# Patient Record
Sex: Female | Born: 1990 | Race: White | Hispanic: No | Marital: Married | Smoking: Never smoker
Health system: Southern US, Community
[De-identification: ages and names within clinical notes are randomized; demographics above are authoritative.]

## PROBLEM LIST (undated history)

## (undated) ENCOUNTER — Inpatient Hospital Stay (HOSPITAL_COMMUNITY): Payer: Self-pay

## (undated) DIAGNOSIS — Z789 Other specified health status: Secondary | ICD-10-CM

## (undated) DIAGNOSIS — O26873 Cervical shortening, third trimester: Secondary | ICD-10-CM

## (undated) DIAGNOSIS — O3433 Maternal care for cervical incompetence, third trimester: Secondary | ICD-10-CM

## (undated) DIAGNOSIS — O4700 False labor before 37 completed weeks of gestation, unspecified trimester: Secondary | ICD-10-CM

## (undated) HISTORY — PX: ANTERIOR CRUCIATE LIGAMENT REPAIR: SHX115

## (undated) HISTORY — PX: EYE SURGERY: SHX253

---
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
## 2010-01-05 ENCOUNTER — Emergency Department (HOSPITAL_COMMUNITY)
Admission: EM | Admit: 2010-01-05 | Discharge: 2010-01-05 | Payer: Self-pay | Source: Home / Self Care | Admitting: Emergency Medicine

## 2010-03-20 LAB — URINALYSIS, ROUTINE W REFLEX MICROSCOPIC
Bilirubin Urine: NEGATIVE
Leukocytes, UA: NEGATIVE
Protein, ur: NEGATIVE mg/dL
Specific Gravity, Urine: 1.013 (ref 1.005–1.030)
Urobilinogen, UA: 0.2 mg/dL (ref 0.0–1.0)

## 2010-03-20 LAB — WET PREP, GENITAL: Yeast Wet Prep HPF POC: NONE SEEN

## 2010-03-20 LAB — URINE MICROSCOPIC-ADD ON

## 2010-03-20 LAB — PREGNANCY, URINE: Preg Test, Ur: NEGATIVE

## 2015-03-02 ENCOUNTER — Other Ambulatory Visit (HOSPITAL_COMMUNITY): Payer: Self-pay | Admitting: Obstetrics and Gynecology

## 2015-03-02 DIAGNOSIS — O283 Abnormal ultrasonic finding on antenatal screening of mother: Secondary | ICD-10-CM

## 2015-03-02 DIAGNOSIS — IMO0001 Reserved for inherently not codable concepts without codable children: Secondary | ICD-10-CM

## 2015-03-02 DIAGNOSIS — Z3A19 19 weeks gestation of pregnancy: Secondary | ICD-10-CM

## 2015-03-02 DIAGNOSIS — Z3689 Encounter for other specified antenatal screening: Secondary | ICD-10-CM

## 2015-03-04 ENCOUNTER — Ambulatory Visit (HOSPITAL_COMMUNITY)
Admission: RE | Admit: 2015-03-04 | Discharge: 2015-03-04 | Disposition: A | Discharge status: Home / Self Care | Payer: BLUE CROSS/BLUE SHIELD | Source: Ambulatory Visit | Attending: Obstetrics and Gynecology | Admitting: Obstetrics and Gynecology

## 2015-03-04 ENCOUNTER — Encounter (HOSPITAL_COMMUNITY): Payer: Self-pay

## 2015-03-04 ENCOUNTER — Other Ambulatory Visit (HOSPITAL_COMMUNITY): Payer: Self-pay | Admitting: Obstetrics and Gynecology

## 2015-03-04 ENCOUNTER — Other Ambulatory Visit (HOSPITAL_COMMUNITY): Payer: Self-pay | Admitting: Maternal and Fetal Medicine

## 2015-03-04 ENCOUNTER — Ambulatory Visit (HOSPITAL_COMMUNITY): Payer: Self-pay

## 2015-03-04 DIAGNOSIS — O359XX2 Maternal care for (suspected) fetal abnormality and damage, unspecified, fetus 2: Secondary | ICD-10-CM

## 2015-03-04 DIAGNOSIS — O358XX0 Maternal care for other (suspected) fetal abnormality and damage, not applicable or unspecified: Secondary | ICD-10-CM | POA: Diagnosis present

## 2015-03-04 DIAGNOSIS — Z3689 Encounter for other specified antenatal screening: Secondary | ICD-10-CM

## 2015-03-04 DIAGNOSIS — O09812 Supervision of pregnancy resulting from assisted reproductive technology, second trimester: Secondary | ICD-10-CM | POA: Diagnosis not present

## 2015-03-04 DIAGNOSIS — O30042 Twin pregnancy, dichorionic/diamniotic, second trimester: Secondary | ICD-10-CM

## 2015-03-04 DIAGNOSIS — Z3A19 19 weeks gestation of pregnancy: Secondary | ICD-10-CM | POA: Insufficient documentation

## 2015-03-04 DIAGNOSIS — Z36 Encounter for antenatal screening of mother: Secondary | ICD-10-CM | POA: Diagnosis not present

## 2015-03-04 DIAGNOSIS — O365922 Maternal care for other known or suspected poor fetal growth, second trimester, fetus 2: Secondary | ICD-10-CM

## 2015-03-04 DIAGNOSIS — O283 Abnormal ultrasonic finding on antenatal screening of mother: Secondary | ICD-10-CM

## 2015-03-04 DIAGNOSIS — IMO0001 Reserved for inherently not codable concepts without codable children: Secondary | ICD-10-CM

## 2015-03-04 HISTORY — DX: Other specified health status: Z78.9

## 2015-03-08 ENCOUNTER — Encounter (HOSPITAL_COMMUNITY): Payer: Self-pay

## 2015-03-25 ENCOUNTER — Ambulatory Visit (HOSPITAL_COMMUNITY)
Admission: RE | Admit: 2015-03-25 | Discharge: 2015-03-25 | Disposition: A | Discharge status: Home / Self Care | Payer: BLUE CROSS/BLUE SHIELD | Source: Ambulatory Visit | Attending: Obstetrics and Gynecology | Admitting: Obstetrics and Gynecology

## 2015-03-25 ENCOUNTER — Other Ambulatory Visit (HOSPITAL_COMMUNITY): Payer: Self-pay | Admitting: Maternal and Fetal Medicine

## 2015-03-25 ENCOUNTER — Encounter (HOSPITAL_COMMUNITY): Payer: Self-pay

## 2015-03-25 DIAGNOSIS — O365922 Maternal care for other known or suspected poor fetal growth, second trimester, fetus 2: Secondary | ICD-10-CM

## 2015-03-25 DIAGNOSIS — O30042 Twin pregnancy, dichorionic/diamniotic, second trimester: Secondary | ICD-10-CM

## 2015-03-25 DIAGNOSIS — O09812 Supervision of pregnancy resulting from assisted reproductive technology, second trimester: Secondary | ICD-10-CM | POA: Diagnosis not present

## 2015-03-25 DIAGNOSIS — O358XX0 Maternal care for other (suspected) fetal abnormality and damage, not applicable or unspecified: Secondary | ICD-10-CM | POA: Diagnosis present

## 2015-03-25 DIAGNOSIS — O359XX0 Maternal care for (suspected) fetal abnormality and damage, unspecified, not applicable or unspecified: Secondary | ICD-10-CM

## 2015-03-25 DIAGNOSIS — Z3A22 22 weeks gestation of pregnancy: Secondary | ICD-10-CM | POA: Insufficient documentation

## 2015-04-15 ENCOUNTER — Encounter (HOSPITAL_COMMUNITY): Payer: Self-pay

## 2015-04-15 ENCOUNTER — Ambulatory Visit (HOSPITAL_COMMUNITY)
Admission: RE | Admit: 2015-04-15 | Discharge: 2015-04-15 | Disposition: A | Discharge status: Home / Self Care | Payer: BLUE CROSS/BLUE SHIELD | Source: Ambulatory Visit | Attending: Obstetrics and Gynecology | Admitting: Obstetrics and Gynecology

## 2015-04-15 ENCOUNTER — Other Ambulatory Visit (HOSPITAL_COMMUNITY): Payer: Self-pay | Admitting: Maternal and Fetal Medicine

## 2015-04-15 DIAGNOSIS — O30042 Twin pregnancy, dichorionic/diamniotic, second trimester: Secondary | ICD-10-CM | POA: Diagnosis not present

## 2015-04-15 DIAGNOSIS — O09812 Supervision of pregnancy resulting from assisted reproductive technology, second trimester: Secondary | ICD-10-CM

## 2015-04-15 DIAGNOSIS — O09813 Supervision of pregnancy resulting from assisted reproductive technology, third trimester: Secondary | ICD-10-CM

## 2015-04-15 DIAGNOSIS — O365922 Maternal care for other known or suspected poor fetal growth, second trimester, fetus 2: Secondary | ICD-10-CM | POA: Diagnosis not present

## 2015-04-15 DIAGNOSIS — O26879 Cervical shortening, unspecified trimester: Secondary | ICD-10-CM

## 2015-04-15 DIAGNOSIS — Z3A25 25 weeks gestation of pregnancy: Secondary | ICD-10-CM

## 2015-04-15 DIAGNOSIS — IMO0002 Reserved for concepts with insufficient information to code with codable children: Secondary | ICD-10-CM

## 2015-04-15 DIAGNOSIS — O359XX2 Maternal care for (suspected) fetal abnormality and damage, unspecified, fetus 2: Secondary | ICD-10-CM

## 2015-04-15 DIAGNOSIS — Z3A28 28 weeks gestation of pregnancy: Secondary | ICD-10-CM

## 2015-04-15 DIAGNOSIS — O358XX0 Maternal care for other (suspected) fetal abnormality and damage, not applicable or unspecified: Secondary | ICD-10-CM | POA: Diagnosis present

## 2015-05-06 ENCOUNTER — Ambulatory Visit (HOSPITAL_COMMUNITY): Admission: RE | Admit: 2015-05-06 | Payer: BLUE CROSS/BLUE SHIELD | Source: Ambulatory Visit

## 2015-05-26 ENCOUNTER — Inpatient Hospital Stay (HOSPITAL_COMMUNITY)
Admission: AD | Admit: 2015-05-26 | Discharge: 2015-05-26 | Disposition: A | Discharge status: Home / Self Care | Payer: BLUE CROSS/BLUE SHIELD | Source: Ambulatory Visit | Attending: Obstetrics and Gynecology | Admitting: Obstetrics and Gynecology

## 2015-05-26 DIAGNOSIS — O4700 False labor before 37 completed weeks of gestation, unspecified trimester: Secondary | ICD-10-CM

## 2015-05-26 DIAGNOSIS — O3433 Maternal care for cervical incompetence, third trimester: Secondary | ICD-10-CM

## 2015-05-26 DIAGNOSIS — O4703 False labor before 37 completed weeks of gestation, third trimester: Secondary | ICD-10-CM | POA: Diagnosis present

## 2015-05-26 DIAGNOSIS — Z3A31 31 weeks gestation of pregnancy: Secondary | ICD-10-CM | POA: Insufficient documentation

## 2015-05-26 DIAGNOSIS — O26873 Cervical shortening, third trimester: Secondary | ICD-10-CM

## 2015-05-26 HISTORY — DX: False labor before 37 completed weeks of gestation, unspecified trimester: O47.00

## 2015-05-26 HISTORY — DX: Cervical shortening, third trimester: O26.873

## 2015-05-26 HISTORY — DX: Maternal care for cervical incompetence, third trimester: O34.33

## 2015-05-26 MED ORDER — BETAMETHASONE SOD PHOS & ACET 6 (3-3) MG/ML IJ SUSP
12.0000 mg | Freq: Once | INTRAMUSCULAR | Status: AC
  Administered 2015-05-26: 12 mg via INTRAMUSCULAR
  Filled 2015-05-26: qty 2

## 2015-05-26 NOTE — MAU Note (Addendum)
Pt presents to MAU for betamethasone injection. Pt has twin gestation Baby B passed at [redacted] weeks gestation. Pt denies any bleeding, reports mild sharp abdominal pain at times

## 2015-05-27 ENCOUNTER — Inpatient Hospital Stay (HOSPITAL_COMMUNITY)
Admission: AD | Admit: 2015-05-27 | Discharge: 2015-05-27 | Disposition: A | Discharge status: Home / Self Care | Payer: BLUE CROSS/BLUE SHIELD | Source: Ambulatory Visit | Attending: Obstetrics and Gynecology | Admitting: Obstetrics and Gynecology

## 2015-05-27 DIAGNOSIS — O365930 Maternal care for other known or suspected poor fetal growth, third trimester, not applicable or unspecified: Secondary | ICD-10-CM | POA: Insufficient documentation

## 2015-05-27 DIAGNOSIS — Z3A31 31 weeks gestation of pregnancy: Secondary | ICD-10-CM | POA: Insufficient documentation

## 2015-05-27 MED ORDER — BETAMETHASONE SOD PHOS & ACET 6 (3-3) MG/ML IJ SUSP
12.0000 mg | Freq: Once | INTRAMUSCULAR | Status: AC
  Administered 2015-05-27: 12 mg via INTRAMUSCULAR
  Filled 2015-05-27: qty 2

## 2015-06-08 ENCOUNTER — Encounter (HOSPITAL_COMMUNITY): Payer: Self-pay | Admitting: Obstetrics and Gynecology

## 2015-06-08 DIAGNOSIS — O26873 Cervical shortening, third trimester: Secondary | ICD-10-CM

## 2015-06-08 DIAGNOSIS — O3433 Maternal care for cervical incompetence, third trimester: Secondary | ICD-10-CM

## 2015-06-08 HISTORY — DX: Maternal care for cervical incompetence, third trimester: O34.33

## 2015-06-08 HISTORY — DX: Cervical shortening, third trimester: O26.873

## 2015-06-16 ENCOUNTER — Encounter (HOSPITAL_COMMUNITY): Payer: Self-pay | Admitting: Obstetrics and Gynecology

## 2015-06-16 DIAGNOSIS — O4700 False labor before 37 completed weeks of gestation, unspecified trimester: Secondary | ICD-10-CM

## 2015-06-16 HISTORY — DX: False labor before 37 completed weeks of gestation, unspecified trimester: O47.00

## 2015-06-19 ENCOUNTER — Encounter (HOSPITAL_COMMUNITY): Payer: Self-pay | Admitting: *Deleted

## 2015-06-19 ENCOUNTER — Inpatient Hospital Stay (HOSPITAL_COMMUNITY)
Admission: AD | Admit: 2015-06-19 | Discharge: 2015-06-19 | Disposition: A | Discharge status: Other Acute Inpatient Hospital | Payer: BLUE CROSS/BLUE SHIELD | Source: Ambulatory Visit | Attending: Obstetrics and Gynecology | Admitting: Obstetrics and Gynecology

## 2015-06-19 DIAGNOSIS — O30043 Twin pregnancy, dichorionic/diamniotic, third trimester: Secondary | ICD-10-CM | POA: Insufficient documentation

## 2015-06-19 DIAGNOSIS — O26873 Cervical shortening, third trimester: Secondary | ICD-10-CM

## 2015-06-19 DIAGNOSIS — O133 Gestational [pregnancy-induced] hypertension without significant proteinuria, third trimester: Secondary | ICD-10-CM | POA: Diagnosis not present

## 2015-06-19 DIAGNOSIS — Z3A34 34 weeks gestation of pregnancy: Secondary | ICD-10-CM | POA: Insufficient documentation

## 2015-06-19 DIAGNOSIS — O3433 Maternal care for cervical incompetence, third trimester: Secondary | ICD-10-CM

## 2015-06-19 LAB — COMPREHENSIVE METABOLIC PANEL
ALBUMIN: 3 g/dL — AB (ref 3.5–5.0)
ALT: 30 U/L (ref 14–54)
AST: 31 U/L (ref 15–41)
Alkaline Phosphatase: 84 U/L (ref 38–126)
Anion gap: 8 (ref 5–15)
BUN: 14 mg/dL (ref 6–20)
CHLORIDE: 104 mmol/L (ref 101–111)
CO2: 21 mmol/L — ABNORMAL LOW (ref 22–32)
Calcium: 8.6 mg/dL — ABNORMAL LOW (ref 8.9–10.3)
Creatinine, Ser: 0.55 mg/dL (ref 0.44–1.00)
GFR calc Af Amer: >60 mL/min (ref >60)
GFR calc non Af Amer: >60 mL/min (ref >60)
GLUCOSE: 79 mg/dL (ref 65–99)
POTASSIUM: 4.6 mmol/L (ref 3.5–5.1)
Sodium: 133 mmol/L — ABNORMAL LOW (ref 135–145)
Total Bilirubin: 0.8 mg/dL (ref 0.3–1.2)
Total Protein: 6.8 g/dL (ref 6.5–8.1)

## 2015-06-19 LAB — CBC
HCT: 33.9 % — ABNORMAL LOW (ref 36.0–46.0)
Hemoglobin: 11.8 g/dL — ABNORMAL LOW (ref 12.0–15.0)
MCH: 30.7 pg (ref 26.0–34.0)
MCHC: 34.8 g/dL (ref 30.0–36.0)
MCV: 88.3 fL (ref 78.0–100.0)
PLATELETS: 122 10*3/uL — AB (ref 150–400)
RBC: 3.84 MIL/uL — ABNORMAL LOW (ref 3.87–5.11)
RDW: 13.8 % (ref 11.5–15.5)
WBC: 11 10*3/uL — AB (ref 4.0–10.5)

## 2015-06-19 LAB — PROTEIN / CREATININE RATIO, URINE
CREATININE, URINE: 111 mg/dL
Protein Creatinine Ratio: 0.45 mg/mg{Cre} — ABNORMAL HIGH (ref 0.00–0.15)
Total Protein, Urine: 50 mg/dL

## 2015-06-19 LAB — URINALYSIS, ROUTINE W REFLEX MICROSCOPIC
BILIRUBIN URINE: NEGATIVE
Glucose, UA: NEGATIVE mg/dL
HGB URINE DIPSTICK: NEGATIVE
Ketones, ur: NEGATIVE mg/dL
Leukocytes, UA: NEGATIVE
Nitrite: NEGATIVE
PROTEIN: 30 mg/dL — AB
Specific Gravity, Urine: 1.015 (ref 1.005–1.030)
pH: 6.5 (ref 5.0–8.0)

## 2015-06-19 LAB — URINE MICROSCOPIC-ADD ON: Bacteria, UA: NONE SEEN

## 2015-06-19 MED ORDER — BETAMETHASONE SOD PHOS & ACET 6 (3-3) MG/ML IJ SUSP
12.0000 mg | Freq: Once | INTRAMUSCULAR | Status: AC
  Administered 2015-06-19: 12 mg via INTRAMUSCULAR
  Filled 2015-06-19: qty 2

## 2015-06-19 MED ORDER — LACTATED RINGERS IV BOLUS (SEPSIS)
1000.0000 mL | Freq: Once | INTRAVENOUS | Status: AC
  Administered 2015-06-19: 1000 mL via INTRAVENOUS

## 2015-06-19 MED ORDER — TERBUTALINE SULFATE 1 MG/ML IJ SOLN
0.2500 mg | Freq: Once | INTRAMUSCULAR | Status: AC
  Administered 2015-06-19: 0.25 mg via SUBCUTANEOUS

## 2015-06-19 MED ORDER — TERBUTALINE SULFATE 1 MG/ML IJ SOLN
0.2500 mg | Freq: Once | INTRAMUSCULAR | Status: AC
  Administered 2015-06-19: 0.25 mg via SUBCUTANEOUS
  Filled 2015-06-19: qty 1

## 2015-06-19 MED ORDER — LACTATED RINGERS IV SOLN
INTRAVENOUS | Status: DC
  Administered 2015-06-19: 10:00:00 via INTRAVENOUS

## 2015-06-19 NOTE — MAU Note (Signed)
Pt states she was having some abdominal cramping every 10 minutes for about an hour and a half around 0600 this morning.  Pt states she is feeling the baby move.

## 2015-06-19 NOTE — H&P (Signed)
Brandy Taylor is a 25 y.o. female G1P0 at 25 6/7 weeks (EDD 07/25/15 by [place] at 5 weeks)  presenting for contractions q 5-7 minutes.  Baby is breech and she is 3-4cm dilated.   Pt has had a complicated pregnancy.  1)  IUFD of twin B and growth restriction of remaining twin A- She has a history of infertility and conceived on femara and timed intercourse.  The pregnancy was initially a twin gestation, but twin B had multiple anomalies and poor growth and ultimately succumbed with an IUFD noted around [redacted] weeks gestation on a routine f/u scan.  Twin A has continued to grow but has had a growth lag with last [place] for growth 06/07/15 demonstrating an EFW of 16.5%ile, AC of <5%ile and AFI of 9.  She has been followed closely with weekly BPP and dopplers as well as NST's.  Her last BPP on 06/16/15 was 8/8 AFI was 6.5 and dopplers 95%ile but no absent or reversed flow.    2)  Cervical shortening and dilation--She began to have cervical shortening at 25 weeks noted at 2.5cm and that has been progressive down to .65cm with 3cm dilation noted at 32+weeks.  She has been on progesterone and bedrest.   Cervix on evaluation today is 70/3-4/BBOW  3)  Gestational hypertension--Pt began to have some mildly elevated BP at 30 weeks 130/80-90 range.  PIH labs were negative with a platelet count of 124K that remained stable on repeat.  She has no symptoms of HA.  Today labs are WNL except an elevated protein:creatinine ratio of 0.45  Maternal Medical History:  Reason for admission: Contractions.   Contractions: Onset was 1-2 hours ago.   Frequency: regular.   Perceived severity is mild.    Fetal activity: Perceived fetal activity is normal.    Prenatal complications: PIH and IUGR.   Prenatal Complications - Diabetes: none.    OB History    Gravida Para Term Preterm AB TAB SAB Ectopic Multiple Living   1              Past Medical History  Diagnosis Date  . Medical history non-contributory   . Short cervix with  cervical cerclage in third trimester, antepartum 06/08/2015  . Threatened preterm labor 06/16/2015   Past Surgical History  Procedure Laterality Date  . Anterior cruciate ligament repair     Family History: family history is not on file. Social History:  reports that she has never smoked. She has never used smokeless tobacco. She reports that she does not drink alcohol or use illicit drugs.   Prenatal Transfer Tool  Maternal Diabetes: No Genetic Screening: Normal--Pt had informaseq when both twins were viable with Y chromosome noted and no increased risk Trisomies Maternal Ultrasounds/Referrals: Abnormal:  Findings:   IUGR Fetal Ultrasounds or other Referrals:  Referred to Materal Fetal Medicine  Maternal Substance Abuse:  No Significant Maternal Medications:  None Significant Maternal Lab Results:  None Other Comments:  None  Review of Systems  Gastrointestinal: Positive for abdominal pain.  Neurological: Negative for headaches.    Dilation: 3.5 Effacement (%): 70 Station: -1 Exam by:: Venia Carbon, NP Blood pressure 141/87, pulse 104, temperature 98 F (36.7 C), temperature source Oral, resp. rate 16, last menstrual period 10/18/2014, SpO2 99 %. Maternal Exam:  Uterine Assessment: Contraction strength is mild.  Contraction frequency is regular.   Abdomen: Patient reports no abdominal tenderness. Fetal presentation: breech  Introitus: Normal vulva. Normal vagina.    Fetal Exam  Fetal Monitor Review: Variability: moderate (6-25 bpm).   Pattern: accelerations present and no decelerations.    Fetal State Assessment: Category I - tracings are normal.     Physical Exam  Constitutional: She is oriented to person, place, and time. She appears well-developed.  Cardiovascular: Normal rate and regular rhythm.   Respiratory: Effort normal.  GI: Soft.  Genitourinary: Vagina normal.  Neurological: She is alert and oriented to person, place, and time.  Psychiatric: She has a  normal mood and affect.    Prenatal labs: ABO, Rh:  O positive Antibody:  negative Rubella:  Immune RPR:   NR HBsAg:   Neg HIV:   NR GBS:   unknown One hour GCT 88 Informaseq negative   Assessment/Plan: Pt with contractions and cervical dilation of breech presentation.  Responded well to one shot of terbutaline and fluid bolus with no current contractions.  Cervix with some possible slight change to 3-4 cm, but stable.  Mild hypertension with normal PIH labs except a protein creatinine ratio of 0.45.  Our NICU has no available beds or equipment and is asking if any possibility of delivery pt be transferred to [facility].   D/w Dr. Berna SpareJosh Nitsche and he will accept pt for observation.  Last steroids received were 5/18 and 5/19 so will repeat dose now prior to transfer.  [facility] has been.   Oliver PilaICHARDSON,Ramy Greth W 06/19/2015, 11:01 AM

## 2015-06-19 NOTE — MAU Provider Note (Signed)
History     CSN: [id number]  Arrival date and time: 06/19/15 0835   None     Chief Complaint  Patient presents with  . Abdominal Pain   HPI   Ms. Brandy Taylor is a 25 y.o. female G1P0 Di/Di twins baby A with breech, baby B IUFD, here today in MAU with contractions.   She is feeling contractions every 10 mins, now feeling tightening every 5-7 mins. She rates her contraction pain 5/10 No bleeding, no leaking of fluid + fetal movement  Uses vaginal progesterone daily. Received betamethasone 5/18 and 5/19.   OB History    Gravida Para Term Preterm AB TAB SAB Ectopic Multiple Living   1               Past Medical History  Diagnosis Date  . Medical history non-contributory   . Short cervix with cervical cerclage in third trimester, antepartum 06/08/2015  . Threatened preterm labor 06/16/2015    Past Surgical History  Procedure Laterality Date  . Anterior cruciate ligament repair      History reviewed. No pertinent family history.  Social History  Substance Use Topics  . Smoking status: Never Smoker   . Smokeless tobacco: Never Used  . Alcohol Use: No    Allergies: No Known Allergies  Prescriptions prior to admission  Medication Sig Dispense Refill Last Dose  . Prenatal Vit-Fe Fumarate-FA (PRENATAL VITAMIN PO) Take 1 tablet by mouth daily.    06/18/2015 at Unknown time  . PROGESTERONE VA Place 1 capsule vaginally at bedtime. Patient was not sure of the mg and no answer at the pharmacy to verify   06/18/2015 at Unknown time   Results for orders placed or performed during the hospital encounter of 06/19/15 (from the past 48 hour(s))  Urinalysis, Routine w reflex microscopic (not at [facility])     Status: Abnormal   Collection Time: 06/19/15  8:41 AM  Result Value Ref Range   Color, Urine YELLOW YELLOW   APPearance CLEAR CLEAR   Specific Gravity, Urine 1.015 1.005 - 1.030   pH 6.5 5.0 - 8.0   Glucose, UA NEGATIVE NEGATIVE mg/dL   Hgb urine dipstick NEGATIVE NEGATIVE    Bilirubin Urine NEGATIVE NEGATIVE   Ketones, ur NEGATIVE NEGATIVE mg/dL   Protein, ur 30 (A) NEGATIVE mg/dL   Nitrite NEGATIVE NEGATIVE   Leukocytes, UA NEGATIVE NEGATIVE  Protein / creatinine ratio, urine     Status: Abnormal   Collection Time: 06/19/15  8:41 AM  Result Value Ref Range   Creatinine, Urine 111.00 mg/dL   Total Protein, Urine 50 mg/dL    Comment: NO NORMAL RANGE ESTABLISHED FOR THIS TEST   Protein Creatinine Ratio 0.45 (H) 0.00 - 0.15 mg/mg[Cre]  Urine microscopic-add on     Status: Abnormal   Collection Time: 06/19/15  8:41 AM  Result Value Ref Range   Squamous Epithelial / LPF 0-5 (A) NONE SEEN   WBC, UA 0-5 0 - 5 WBC/hpf   RBC / HPF 0-5 0 - 5 RBC/hpf   Bacteria, UA NONE SEEN NONE SEEN  CBC     Status: Abnormal   Collection Time: 06/19/15  9:15 AM  Result Value Ref Range   WBC 11.0 (H) 4.0 - 10.5 K/uL   RBC 3.84 (L) 3.87 - 5.11 MIL/uL   Hemoglobin 11.8 (L) 12.0 - 15.0 g/dL   HCT 33.9 (L) 36.0 - 46.0 %   MCV 88.3 78.0 - 100.0 fL   MCH 30.7 26.0 -  34.0 pg   MCHC 34.8 30.0 - 36.0 g/dL   RDW 41.7 53.0 - 10.4 %   Platelets 122 (L) 150 - 400 K/uL  Comprehensive metabolic panel     Status: Abnormal   Collection Time: 06/19/15  9:15 AM  Result Value Ref Range   Sodium 133 (L) 135 - 145 mmol/L   Potassium 4.6 3.5 - 5.1 mmol/L   Chloride 104 101 - 111 mmol/L   CO2 21 (L) 22 - 32 mmol/L   Glucose, Bld 79 65 - 99 mg/dL   BUN 14 6 - 20 mg/dL   Creatinine, Ser 0.45 0.44 - 1.00 mg/dL   Calcium 8.6 (L) 8.9 - 10.3 mg/dL   Total Protein 6.8 6.5 - 8.1 g/dL   Albumin 3.0 (L) 3.5 - 5.0 g/dL   AST 31 15 - 41 U/L   ALT 30 14 - 54 U/L   Alkaline Phosphatase 84 38 - 126 U/L   Total Bilirubin 0.8 0.3 - 1.2 mg/dL   GFR calc non Af Amer >60 >60 mL/min   GFR calc Af Amer >60 >60 mL/min    Comment: (NOTE) The eGFR has been calculated using the CKD EPI equation. This calculation has not been validated in all clinical situations. eGFR's persistently <60 mL/min signify  possible Chronic Kidney Disease.    Anion gap 8 5 - 15    Review of Systems  Eyes: Negative for blurred vision.  Neurological: Negative for headaches.   Physical Exam   Blood pressure 141/87, pulse 104, temperature 98 F (36.7 C), temperature source Oral, resp. rate 16, last menstrual period 10/18/2014, SpO2 99 %.   Patient Vitals for the past 24 hrs:  BP Temp Temp src Pulse Resp SpO2  06/19/15 1026 - - - 104 - 99 %  06/19/15 1021 - - - 99 - 98 %  06/19/15 1016 - - - 109 - 99 %  06/19/15 1015 141/87 mmHg - - 104 - -  06/19/15 1011 - - - 115 - 99 %  06/19/15 1006 - - - 106 - 100 %  06/19/15 1001 145/55 mmHg - - 113 - 100 %  06/19/15 1000 - - - 108 - -  06/19/15 0956 - - - (!) 121 - 100 %  06/19/15 0951 - - - 105 - 100 %  06/19/15 0946 - - - 97 - 99 %  06/19/15 0945 156/82 mmHg - - 100 - -  06/19/15 0941 - - - 97 - 100 %  06/19/15 0936 - - - 92 - 99 %  06/19/15 0931 147/84 mmHg - - 91 - 99 %  06/19/15 0930 - - - 93 - -  06/19/15 0926 - - - 90 - 98 %  06/19/15 0916 149/99 mmHg - - 80 - -  06/19/15 0900 137/91 mmHg - - 76 - -  06/19/15 0849 139/95 mmHg 98 F (36.7 C) Oral 82 16 -    Physical Exam  Constitutional: She is oriented to person, place, and time. She appears well-developed and well-nourished. No distress.  GI: Soft. She exhibits no distension. There is no tenderness. There is no rebound and no guarding.  Genitourinary:  Dilation: 3.5 Effacement (%): 70 Station: -1, breech  Exam by:: Venia Carbon, NP  Musculoskeletal: Normal range of motion. She exhibits no edema.  Neurological: She is alert and oriented to person, place, and time.  Skin: Skin is warm. She is not diaphoretic.  Psychiatric: Her behavior is normal.   Cervix  3.5  Effacement 70% -1, breech. Bulging membranes  Fetal Tracing: Baseline: 130 bpm  Variability: Moderate  Accelerations: 15x15 Decelerations: none Toco: initially Q3-5 mins apart, absent following terbutaline   MAU Course   Procedures  none  MDM  LR bolus  CMP CBC  UA Protein creatine ratio  Discussed patient with Dr. Marvel Plan Terbutaline X 2 doses.  Dr. Marvel Plan to MAU to discuss transfer.   Assessment and Plan     Lezlie Lye, NP 06/19/2015 9:10 AM

## 2015-07-06 ENCOUNTER — Inpatient Hospital Stay (HOSPITAL_COMMUNITY): Admit: 2015-07-06 | Payer: Self-pay | Admitting: Obstetrics and Gynecology

## 2015-07-06 ENCOUNTER — Encounter (HOSPITAL_COMMUNITY): Payer: Self-pay

## 2015-07-06 SURGERY — Surgical Case
Anesthesia: Regional

## 2015-07-28 ENCOUNTER — Ambulatory Visit (HOSPITAL_COMMUNITY)
Admission: RE | Admit: 2015-07-28 | Discharge: 2015-07-28 | Disposition: A | Discharge status: Home / Self Care | Payer: BLUE CROSS/BLUE SHIELD | Source: Ambulatory Visit | Attending: Otolaryngology | Admitting: Otolaryngology

## 2015-07-28 NOTE — Lactation Note (Signed)
Lactation Consult  Mother's reason for visit:  History of engorgement/mastitis Visit Type:  Outpatient Consult:  Initial Lactation Consultant:  Judee ClaraSmith, Shalita Notte E  ________________________________________________________________________  Brandy Taylor comes in today for appointment with Lactation on recommendation from her OB doctor Dr. Ferol LuzBogard.  She is 5 weeks post delivery of a 35 week infant.  Baby [facility] N[facility], but is home now and growing well.  He takes her EBM by bottle, with 2 feedings being 22 cal formula (for weight gain).  Mom does not desire assistance with latching baby to the breast.    [name] experienced primary engorgement and treated it successfully with ice and heat alternatively.  [facility]  Her milk supply was good, and she has an abundance of pumped milk in freezer.  She then stated her breasts became reddened, and was started on antibiotics.  Antibiotics were changed as breasts remained tender and reddened.  [name] also states that she had little pumps on her areola that were sore, and eventually "burst" draining pus.  She did admit that there have been nights where she slept through her regular time to pump.  Bella was pumping every 2-3 hrs, but is not every 4-5 hrs.  Both breasts are soft to palpation, no full ducts palpated, or tender areas, or redness.  Both nipples are pink.  Areola irritated looking, with one small crack noted on left nipple base.  [name] states her breasts are much better, no pain.  She denies pain or burning on her nipples or up into breast.  [name] is using a natural cream on her nipples, wasn't sure what it was called or what ingredients were in it.    Plan- 1- Sore nipple breast shells given to wear during day (protect nipples from rubbing on wet breast pads) 2- Coconut oil to nipples and with pumping 3- Try 27 flanges when pumping 4- Ask OB about Rx for Dr. Yevette EdwardsJack Newman's All Purpose Nipple Ointment  (APNO).  [facility]. 5- Try to pump on schedule without missing any, or sleeping through night.  Recommend 20 minutes double pumping, along with manual expression and massage.  Encouraged skin to skin with infant. 6- Call Lactation for any questions or would like a latch assist with baby [name].

## 2016-01-07 ENCOUNTER — Encounter (HOSPITAL_COMMUNITY): Payer: Self-pay
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
## 2016-12-04 DIAGNOSIS — N915 Oligomenorrhea, unspecified: Secondary | ICD-10-CM | POA: Insufficient documentation

## 2018-07-10 DIAGNOSIS — N970 Female infertility associated with anovulation: Secondary | ICD-10-CM | POA: Insufficient documentation

## 2018-09-19 ENCOUNTER — Other Ambulatory Visit: Payer: Self-pay

## 2018-09-19 ENCOUNTER — Emergency Department (HOSPITAL_COMMUNITY): Payer: BC Managed Care – PPO

## 2018-09-19 ENCOUNTER — Emergency Department (HOSPITAL_COMMUNITY)
Admission: EM | Admit: 2018-09-19 | Discharge: 2018-09-20 | Disposition: A | Discharge status: Home / Self Care | Payer: BC Managed Care – PPO | Attending: Emergency Medicine | Admitting: Emergency Medicine

## 2018-09-19 ENCOUNTER — Encounter (HOSPITAL_COMMUNITY): Payer: Self-pay | Admitting: *Deleted

## 2018-09-19 DIAGNOSIS — Y999 Unspecified external cause status: Secondary | ICD-10-CM | POA: Diagnosis not present

## 2018-09-19 DIAGNOSIS — S6992XA Unspecified injury of left wrist, hand and finger(s), initial encounter: Secondary | ICD-10-CM | POA: Diagnosis present

## 2018-09-19 DIAGNOSIS — S52502A Unspecified fracture of the lower end of left radius, initial encounter for closed fracture: Secondary | ICD-10-CM | POA: Insufficient documentation

## 2018-09-19 DIAGNOSIS — Y929 Unspecified place or not applicable: Secondary | ICD-10-CM | POA: Insufficient documentation

## 2018-09-19 DIAGNOSIS — Y9389 Activity, other specified: Secondary | ICD-10-CM | POA: Diagnosis not present
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
## 2018-09-20 MED ORDER — LIDOCAINE HCL (PF) 1 % IJ SOLN: 30.0000 mL | Freq: Once | INTRAMUSCULAR | Status: DC

## 2018-09-20 MED ORDER — OXYCODONE-ACETAMINOPHEN 5-325 MG PO TABS
1.0000 | ORAL_TABLET | Freq: Once | ORAL | Status: AC
  Administered 2018-09-20: 1 via ORAL
  Filled 2018-09-20: qty 1

## 2018-09-20 MED ORDER — OXYCODONE-ACETAMINOPHEN 5-325 MG PO TABS: 2.0000 | ORAL_TABLET | ORAL | 0 refills | Status: DC | PRN

## 2018-09-20 NOTE — Progress Notes (Signed)
Orthopedic Tech Progress Note Patient Details:  Brandy Taylor Nov 18, 1990 [id number]  Ortho Devices Type of Ortho Device: Sugartong splint, Arm sling Ortho Device/Splint Interventions: Adjustment, Application   Post Interventions Patient Tolerated: Well   Melony Overly T 09/20/2018, 1:52 AM
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
## 2018-09-20 NOTE — ED Notes (Signed)
Ortho paged for sugar tong splint application

## 2018-09-20 NOTE — ED Notes (Signed)
Patient verbalized understanding of dc instructions, vss, ambulatory with nad.   

## 2019-07-23 ENCOUNTER — Encounter: Payer: Self-pay | Admitting: Podiatry

## 2019-07-23 NOTE — Progress Notes (Signed)
This encounter was created in error - please disregard.

## 2019-08-04 ENCOUNTER — Other Ambulatory Visit: Payer: Self-pay

## 2019-08-04 ENCOUNTER — Encounter: Payer: Self-pay | Admitting: Podiatry

## 2019-08-04 ENCOUNTER — Ambulatory Visit (INDEPENDENT_AMBULATORY_CARE_PROVIDER_SITE_OTHER): Payer: 59

## 2019-08-04 ENCOUNTER — Ambulatory Visit (INDEPENDENT_AMBULATORY_CARE_PROVIDER_SITE_OTHER): Payer: 59 | Admitting: Podiatry

## 2019-08-04 DIAGNOSIS — M778 Other enthesopathies, not elsewhere classified: Secondary | ICD-10-CM

## 2019-08-04 DIAGNOSIS — M2041 Other hammer toe(s) (acquired), right foot: Secondary | ICD-10-CM | POA: Diagnosis not present

## 2019-08-04 DIAGNOSIS — M2042 Other hammer toe(s) (acquired), left foot: Secondary | ICD-10-CM | POA: Diagnosis not present

## 2019-08-04 DIAGNOSIS — L603 Nail dystrophy: Secondary | ICD-10-CM | POA: Diagnosis not present

## 2019-08-04 NOTE — Progress Notes (Signed)
  Subjective:  Patient ID: Brandy Taylor, female    DOB: [birth year],  MRN: [id number] HPI Chief Complaint  Patient presents with  . Toe Pain    2nd toe bilateral - toes curl under x years, toenail is thick and discolored, pain with exercise  . New Patient (Initial Visit)    29 y.o. female presents with the above complaint.   ROS: Denies fever chills nausea vomiting muscle aches pains calf pain back pain chest pain shortness of breath.  Past Medical History:  Diagnosis Date  . Medical history non-contributory   . Short cervix with cervical cerclage in third trimester, antepartum 06/08/2015  . Threatened preterm labor 06/16/2015   Past Surgical History:  Procedure Laterality Date  . ANTERIOR CRUCIATE LIGAMENT REPAIR      Current Outpatient Medications:  .  zolpidem (AMBIEN) 10 MG tablet, Take 10 mg by mouth at bedtime as needed., Disp: , Rfl:   No Known Allergies Review of Systems Objective:  There were no vitals filed for this visit.  General: Well developed, nourished, in no acute distress, alert and oriented x3   Dermatological: Skin is warm, dry and supple bilateral. Nails x 10 are well maintained; remaining integument appears unremarkable at this time. There are no open sores, no preulcerative lesions, no rash or signs of infection present.  Toenails to the second digit demonstrate subungual hematoma and thickening with subungual debris and what appears to be nail dystrophy.  Cannot rule out onychomycosis.  Vascular: Dorsalis Pedis artery and Posterior Tibial artery pedal pulses are 2/4 bilateral with immedate capillary fill time. Pedal hair growth present. No varicosities and no lower extremity edema present bilateral.   Neruologic: Grossly intact via light touch bilateral. Vibratory intact via tuning fork bilateral. Protective threshold with Semmes Wienstein monofilament intact to all pedal sites bilateral. Patellar and Achilles deep tendon reflexes 2+ bilateral. No Babinski  or clonus noted bilateral.   Musculoskeletal: No gross boney pedal deformities bilateral. No pain, crepitus, or limitation noted with foot and ankle range of motion bilateral. Muscular strength 5/5 in all groups tested bilateral.  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  Mild hammertoe deformities flexible bilateral but are asymptomatic.  Gait: Unassisted, Nonantalgic.    Radiographs:  Radiographs taken today demonstrate an osseously mature individual mild flexible hammertoe deformities are noted.  She does have a moderate to severe malleus to her second digits bilateral no arthritic changes as of yet no acute fractures or findings noted.  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  Plan: Samples of skin and nail were taken today for pathologic evaluation.  We discussed surgery today in great detail consisting of a distal arthroplasty she understands and is amenable to it states that she does not know when she will be able to be off work for 2 weeks.  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  But hopefully when we meet in a month to discuss her toenail she will have an idea of when we can perform the arthroplasty to those toes.     Tahirih Lair T. [place], [place]

## 2019-08-17 ENCOUNTER — Telehealth: Payer: Self-pay | Admitting: *Deleted

## 2019-08-17 NOTE — Telephone Encounter (Signed)
-----   Message from Elinor Parkinson, [place] sent at 08/14/2019  4:30 PM EDT ----- Negative for fungus chronic trauma.  Done need to follow-up with her for this.

## 2019-08-17 NOTE — Telephone Encounter (Signed)
I informed pt of Dr. Geryl Rankins review of results and asked pt is she had an appt to cancel. Pt states she is going to come in at a later date to sign consent form and schedule surgery, so cancel currently scheduled appt.

## 2019-09-08 ENCOUNTER — Ambulatory Visit: Payer: 59 | Admitting: Podiatry

## 2020-06-13 LAB — OB RESULTS CONSOLE ANTIBODY SCREEN: Antibody Screen: NEGATIVE

## 2020-06-13 LAB — OB RESULTS CONSOLE GC/CHLAMYDIA
Chlamydia: NEGATIVE
Gonorrhea: NEGATIVE

## 2020-06-13 LAB — OB RESULTS CONSOLE RUBELLA ANTIBODY, IGM: Rubella: IMMUNE

## 2020-06-13 LAB — OB RESULTS CONSOLE ABO/RH: RH Type: POSITIVE

## 2020-06-13 LAB — OB RESULTS CONSOLE HIV ANTIBODY (ROUTINE TESTING): HIV: NONREACTIVE

## 2020-06-13 LAB — OB RESULTS CONSOLE HEPATITIS B SURFACE ANTIGEN: Hepatitis B Surface Ag: NEGATIVE

## 2020-06-13 LAB — OB RESULTS CONSOLE RPR: RPR: NONREACTIVE

## 2020-12-09 LAB — OB RESULTS CONSOLE GBS: GBS: NEGATIVE

## 2020-12-23 ENCOUNTER — Encounter (HOSPITAL_COMMUNITY): Payer: Self-pay | Admitting: *Deleted

## 2020-12-23 NOTE — Patient Instructions (Signed)
Brandy Taylor  12/23/2020   Your procedure is scheduled on:  12/29/2020  Arrive at 0530 at Entrance C on [address] at [facility]  and [employer]. You are invited to use the FREE valet parking or use the Visitor's parking deck.  Pick up the phone at the desk and dial [phone].  Call this number if you have problems the morning of surgery: [phone]  Remember:   Do not eat food:(After Midnight) Desps de medianoche.  Do not drink clear liquids: (After Midnight) Desps de medianoche.  Take these medicines the morning of surgery with A SIP OF WATER:  none   Do not wear jewelry, make-up or nail polish.  Do not wear lotions, powders, or perfumes. Do not wear deodorant.  Do not shave 48 hours prior to surgery.  Do not bring valuables to the hospital.  [facility] is not   responsible for any belongings or valuables brought to the hospital.  Contacts, dentures or bridgework may not be worn into surgery.  Leave suitcase in the car. After surgery it may be brought to your room.  For patients admitted to the hospital, checkout time is 11:00 AM the day of              discharge.      Please read over the following fact sheets that you were given:     Preparing for Surgery

## 2020-12-27 ENCOUNTER — Other Ambulatory Visit: Payer: Self-pay

## 2020-12-27 ENCOUNTER — Other Ambulatory Visit (HOSPITAL_COMMUNITY)
Admission: RE | Admit: 2020-12-27 | Discharge: 2020-12-27 | Disposition: A | Discharge status: Home / Self Care | Payer: 59 | Source: Ambulatory Visit | Attending: Obstetrics and Gynecology | Admitting: Obstetrics and Gynecology

## 2020-12-27 ENCOUNTER — Other Ambulatory Visit: Payer: Self-pay | Admitting: Obstetrics and Gynecology

## 2020-12-27 DIAGNOSIS — O34211 Maternal care for low transverse scar from previous cesarean delivery: Secondary | ICD-10-CM

## 2020-12-27 DIAGNOSIS — Z01812 Encounter for preprocedural laboratory examination: Secondary | ICD-10-CM | POA: Insufficient documentation

## 2020-12-27 LAB — CBC
HCT: 32.4 % — ABNORMAL LOW (ref 36.0–46.0)
Hemoglobin: 10.9 g/dL — ABNORMAL LOW (ref 12.0–15.0)
MCH: 29.8 pg (ref 26.0–34.0)
MCHC: 33.6 g/dL (ref 30.0–36.0)
MCV: 88.5 fL (ref 80.0–100.0)
Platelets: 159 10*3/uL (ref 150–400)
RBC: 3.66 MIL/uL — ABNORMAL LOW (ref 3.87–5.11)
RDW: 13.2 % (ref 11.5–15.5)
WBC: 7.9 10*3/uL (ref 4.0–10.5)
nRBC: 0 % (ref 0.0–0.2)

## 2020-12-27 LAB — TYPE AND SCREEN
ABO/RH(D): O POS
Antibody Screen: NEGATIVE

## 2020-12-27 LAB — SARS CORONAVIRUS 2 (TAT 6-24 HRS): SARS Coronavirus 2: NEGATIVE

## 2020-12-28 LAB — RPR: RPR Ser Ql: NONREACTIVE

## 2020-12-28 NOTE — H&P (Signed)
Brandy Taylor is a 30 y.o. female, G2 P36, EGA [redacted] weeks with [facility] 12-29 presenting for repeat c-section.  Previous LTCS, declines VBAC.  PNC essentially uncomplicated, on baby ASA for h/o preeclampsia.  OB History     Gravida  2   Para      Term      Preterm      AB      Living         SAB      IAB      Ectopic      Multiple      Live Births             Past Medical History:  Diagnosis Date   Medical history non-contributory    Short cervix with cervical cerclage in third trimester, antepartum 06/08/2015   Threatened preterm labor 06/16/2015   Past Surgical History:  Procedure Laterality Date   ANTERIOR CRUCIATE LIGAMENT REPAIR     CESAREAN SECTION     EYE SURGERY     Family History: family history includes Hypertension in her maternal grandmother and mother. Social History:  reports that she has never smoked. She has never used smokeless tobacco. She reports that she does not drink alcohol and does not use drugs.     Maternal Diabetes: No Genetic Screening: Declined Maternal Ultrasounds/Referrals: Normal Fetal Ultrasounds or other Referrals:  None Maternal Substance Abuse:  No Significant Maternal Medications:  None Significant Maternal Lab Results:  Group B Strep negative Other Comments:  None  Review of Systems  Respiratory: Negative.    Cardiovascular: Negative.   Maternal Medical History:  Fetal activity: Perceived fetal activity is normal.   Prenatal complications: no prenatal complications Prenatal Complications - Diabetes: none.    unknown if currently breastfeeding. Maternal Exam:  Abdomen: Patient reports no abdominal tenderness. Surgical scars: low transverse.   Estimated fetal weight is 7 lbs.   Fetal presentation: vertex Introitus: Normal vulva. Normal vagina.  Amniotic fluid character: not assessed.  Physical Exam Constitutional:      Appearance: Normal appearance.  Cardiovascular:     Rate and Rhythm: Normal rate and  regular rhythm.     Heart sounds: Normal heart sounds. No murmur heard. Pulmonary:     Effort: Pulmonary effort is normal. No respiratory distress.     Breath sounds: Normal breath sounds.  Abdominal:     Palpations: Abdomen is soft.  Genitourinary:    General: Normal vulva.  Neurological:     Mental Status: She is alert.    Prenatal labs: ABO, Rh: --/--/O POS (12/20 4696) Antibody: NEG (12/20 0907) Rubella: Immune (06/06 0000) RPR: NON REACTIVE (12/20 0908)  HBsAg: Negative (06/06 0000)  HIV: Non-reactive (06/06 0000)  GBS: Negative/-- (12/02 0000)   Assessment/Plan: IUP at 39 weeks, previous c-section, declines TOLAC.  C-section procedure and risks discussed.  Admit for repeat c-section   Zenaida Niece 12/28/2020, 12:30 PM

## 2020-12-29 ENCOUNTER — Inpatient Hospital Stay (HOSPITAL_COMMUNITY): Payer: 59 | Admitting: Anesthesiology

## 2020-12-29 ENCOUNTER — Inpatient Hospital Stay (HOSPITAL_COMMUNITY)
Admission: RE | Admit: 2020-12-29 | Discharge: 2020-12-31 | DRG: 787 | Disposition: A | Discharge status: Home / Self Care | Payer: 59 | Attending: Obstetrics and Gynecology | Admitting: Obstetrics and Gynecology

## 2020-12-29 ENCOUNTER — Encounter (HOSPITAL_COMMUNITY)
Admission: RE | Disposition: A | Discharge status: Home / Self Care | Payer: Self-pay | Source: Home / Self Care | Attending: Obstetrics and Gynecology

## 2020-12-29 ENCOUNTER — Encounter (HOSPITAL_COMMUNITY): Payer: Self-pay | Admitting: Obstetrics and Gynecology

## 2020-12-29 ENCOUNTER — Other Ambulatory Visit: Payer: Self-pay

## 2020-12-29 DIAGNOSIS — O9081 Anemia of the puerperium: Secondary | ICD-10-CM | POA: Diagnosis not present

## 2020-12-29 DIAGNOSIS — Z3A39 39 weeks gestation of pregnancy: Secondary | ICD-10-CM

## 2020-12-29 DIAGNOSIS — D62 Acute posthemorrhagic anemia: Secondary | ICD-10-CM | POA: Diagnosis not present

## 2020-12-29 DIAGNOSIS — Z98891 History of uterine scar from previous surgery: Secondary | ICD-10-CM

## 2020-12-29 DIAGNOSIS — O34211 Maternal care for low transverse scar from previous cesarean delivery: Principal | ICD-10-CM | POA: Diagnosis present

## 2020-12-29 SURGERY — Surgical Case
Anesthesia: Spinal

## 2020-12-29 MED ORDER — KETOROLAC TROMETHAMINE 30 MG/ML IJ SOLN: 30.0000 mg | Freq: Four times a day (QID) | INTRAMUSCULAR | Status: AC | PRN

## 2020-12-29 MED ORDER — PRENATAL MULTIVITAMIN CH
1.0000 | ORAL_TABLET | Freq: Every day | ORAL | Status: DC
  Administered 2020-12-29 – 2020-12-31 (×3): 1 via ORAL
  Filled 2020-12-29 (×3): qty 1

## 2020-12-29 MED ORDER — NALOXONE HCL 4 MG/10ML IJ SOLN
1.0000 ug/kg/h | INTRAVENOUS | Status: DC | PRN
  Filled 2020-12-29: qty 5

## 2020-12-29 MED ORDER — ZOLPIDEM TARTRATE 5 MG PO TABS: 5.0000 mg | ORAL_TABLET | Freq: Every evening | ORAL | Status: DC | PRN

## 2020-12-29 MED ORDER — SODIUM CHLORIDE 0.9% FLUSH: 3.0000 mL | INTRAVENOUS | Status: DC | PRN

## 2020-12-29 MED ORDER — KETOROLAC TROMETHAMINE 30 MG/ML IJ SOLN
30.0000 mg | Freq: Once | INTRAMUSCULAR | Status: AC | PRN
  Administered 2020-12-29: 09:00:00 30 mg via INTRAVENOUS

## 2020-12-29 MED ORDER — SODIUM CHLORIDE 0.9 % IR SOLN
Status: DC | PRN
  Administered 2020-12-29: 1

## 2020-12-29 MED ORDER — DIBUCAINE (PERIANAL) 1 % EX OINT: 1.0000 "application " | TOPICAL_OINTMENT | CUTANEOUS | Status: DC | PRN

## 2020-12-29 MED ORDER — STERILE WATER FOR IRRIGATION IR SOLN
Status: DC | PRN
  Administered 2020-12-29: 1000 mL

## 2020-12-29 MED ORDER — DIPHENHYDRAMINE HCL 25 MG PO CAPS: 25.0000 mg | ORAL_CAPSULE | ORAL | Status: DC | PRN

## 2020-12-29 MED ORDER — BUPIVACAINE IN DEXTROSE 0.75-8.25 % IT SOLN
INTRATHECAL | Status: DC | PRN
  Administered 2020-12-29: 1.6 mL via INTRATHECAL

## 2020-12-29 MED ORDER — OXYCODONE HCL 5 MG PO TABS: 5.0000 mg | ORAL_TABLET | ORAL | Status: DC | PRN

## 2020-12-29 MED ORDER — FENTANYL CITRATE (PF) 100 MCG/2ML IJ SOLN: 25.0000 ug | INTRAMUSCULAR | Status: DC | PRN

## 2020-12-29 MED ORDER — CEFAZOLIN SODIUM-DEXTROSE 2-4 GM/100ML-% IV SOLN
2.0000 g | INTRAVENOUS | Status: AC
  Administered 2020-12-29: 07:00:00 2 g via INTRAVENOUS

## 2020-12-29 MED ORDER — ACETAMINOPHEN 10 MG/ML IV SOLN
INTRAVENOUS | Status: DC | PRN
  Administered 2020-12-29: 1000 mg via INTRAVENOUS

## 2020-12-29 MED ORDER — MENTHOL 3 MG MT LOZG: 1.0000 | LOZENGE | OROMUCOSAL | Status: DC | PRN

## 2020-12-29 MED ORDER — MORPHINE SULFATE (PF) 0.5 MG/ML IJ SOLN
INTRAMUSCULAR | Status: AC
  Filled 2020-12-29: qty 10

## 2020-12-29 MED ORDER — POVIDONE-IODINE 10 % EX SWAB
2.0000 "application " | Freq: Once | CUTANEOUS | Status: AC
  Administered 2020-12-29: 2 via TOPICAL

## 2020-12-29 MED ORDER — MAGNESIUM HYDROXIDE 400 MG/5ML PO SUSP: 30.0000 mL | ORAL | Status: DC | PRN

## 2020-12-29 MED ORDER — LACTATED RINGERS IV SOLN: INTRAVENOUS | Status: DC

## 2020-12-29 MED ORDER — DIPHENHYDRAMINE HCL 25 MG PO CAPS: 25.0000 mg | ORAL_CAPSULE | Freq: Four times a day (QID) | ORAL | Status: DC | PRN

## 2020-12-29 MED ORDER — CEFAZOLIN SODIUM-DEXTROSE 2-4 GM/100ML-% IV SOLN
INTRAVENOUS | Status: AC
  Filled 2020-12-29: qty 100

## 2020-12-29 MED ORDER — KETOROLAC TROMETHAMINE 30 MG/ML IJ SOLN
30.0000 mg | Freq: Four times a day (QID) | INTRAMUSCULAR | Status: AC
  Administered 2020-12-29 – 2020-12-30 (×3): 30 mg via INTRAVENOUS
  Filled 2020-12-29 (×3): qty 1

## 2020-12-29 MED ORDER — ONDANSETRON HCL 4 MG/2ML IJ SOLN
INTRAMUSCULAR | Status: DC | PRN
  Administered 2020-12-29: 4 mg via INTRAVENOUS

## 2020-12-29 MED ORDER — DEXAMETHASONE SODIUM PHOSPHATE 10 MG/ML IJ SOLN
INTRAMUSCULAR | Status: DC | PRN
Start: 2020-12-29 — End: 2020-12-29
  Administered 2020-12-29: 10 mg via INTRAVENOUS

## 2020-12-29 MED ORDER — METHYLERGONOVINE MALEATE 0.2 MG/ML IJ SOLN: 0.2000 mg | INTRAMUSCULAR | Status: DC | PRN

## 2020-12-29 MED ORDER — PHENYLEPHRINE HCL-NACL 20-0.9 MG/250ML-% IV SOLN
INTRAVENOUS | Status: AC
  Filled 2020-12-29: qty 250

## 2020-12-29 MED ORDER — OXYTOCIN-SODIUM CHLORIDE 30-0.9 UT/500ML-% IV SOLN
INTRAVENOUS | Status: AC
  Filled 2020-12-29: qty 500

## 2020-12-29 MED ORDER — OXYTOCIN-SODIUM CHLORIDE 30-0.9 UT/500ML-% IV SOLN: 2.5000 [IU]/h | INTRAVENOUS | Status: AC

## 2020-12-29 MED ORDER — FENTANYL CITRATE (PF) 100 MCG/2ML IJ SOLN
INTRAMUSCULAR | Status: AC
  Filled 2020-12-29: qty 2

## 2020-12-29 MED ORDER — OXYTOCIN-SODIUM CHLORIDE 30-0.9 UT/500ML-% IV SOLN
INTRAVENOUS | Status: DC | PRN
  Administered 2020-12-29: 300 mL via INTRAVENOUS

## 2020-12-29 MED ORDER — ACETAMINOPHEN 10 MG/ML IV SOLN
INTRAVENOUS | Status: AC
  Filled 2020-12-29: qty 100

## 2020-12-29 MED ORDER — WITCH HAZEL-GLYCERIN EX PADS: 1.0000 "application " | MEDICATED_PAD | CUTANEOUS | Status: DC | PRN

## 2020-12-29 MED ORDER — SCOPOLAMINE 1 MG/3DAYS TD PT72
1.0000 | MEDICATED_PATCH | Freq: Once | TRANSDERMAL | Status: DC
  Administered 2020-12-29: 12:00:00 1.5 mg via TRANSDERMAL
  Filled 2020-12-29: qty 1

## 2020-12-29 MED ORDER — ACETAMINOPHEN 500 MG PO TABS
1000.0000 mg | ORAL_TABLET | Freq: Four times a day (QID) | ORAL | Status: DC
  Administered 2020-12-29 – 2020-12-31 (×8): 1000 mg via ORAL
  Filled 2020-12-29 (×8): qty 2

## 2020-12-29 MED ORDER — IBUPROFEN 600 MG PO TABS
600.0000 mg | ORAL_TABLET | Freq: Four times a day (QID) | ORAL | Status: DC
  Administered 2020-12-30 – 2020-12-31 (×5): 600 mg via ORAL
  Filled 2020-12-29 (×5): qty 1

## 2020-12-29 MED ORDER — TETANUS-DIPHTH-ACELL PERTUSSIS 5-2.5-18.5 LF-MCG/0.5 IM SUSY: 0.5000 mL | PREFILLED_SYRINGE | Freq: Once | INTRAMUSCULAR | Status: DC

## 2020-12-29 MED ORDER — FENTANYL CITRATE (PF) 100 MCG/2ML IJ SOLN
INTRAMUSCULAR | Status: DC | PRN
  Administered 2020-12-29: 15 ug via INTRATHECAL

## 2020-12-29 MED ORDER — ACETAMINOPHEN 500 MG PO TABS: 1000.0000 mg | ORAL_TABLET | Freq: Four times a day (QID) | ORAL | Status: DC

## 2020-12-29 MED ORDER — COCONUT OIL OIL: 1.0000 "application " | TOPICAL_OIL | Status: DC | PRN

## 2020-12-29 MED ORDER — KETOROLAC TROMETHAMINE 30 MG/ML IJ SOLN
INTRAMUSCULAR | Status: AC
  Filled 2020-12-29: qty 1

## 2020-12-29 MED ORDER — SENNOSIDES-DOCUSATE SODIUM 8.6-50 MG PO TABS
2.0000 | ORAL_TABLET | ORAL | Status: DC
  Administered 2020-12-29 – 2020-12-31 (×3): 2 via ORAL
  Filled 2020-12-29 (×3): qty 2

## 2020-12-29 MED ORDER — PHENYLEPHRINE HCL-NACL 20-0.9 MG/250ML-% IV SOLN
INTRAVENOUS | Status: DC | PRN
  Administered 2020-12-29: 30 ug/min via INTRAVENOUS
  Administered 2020-12-29: 60 ug/min via INTRAVENOUS

## 2020-12-29 MED ORDER — DIPHENHYDRAMINE HCL 50 MG/ML IJ SOLN: 12.5000 mg | INTRAMUSCULAR | Status: DC | PRN

## 2020-12-29 MED ORDER — NALOXONE HCL 0.4 MG/ML IJ SOLN: 0.4000 mg | INTRAMUSCULAR | Status: DC | PRN

## 2020-12-29 MED ORDER — MEASLES, MUMPS & RUBELLA VAC IJ SOLR: 0.5000 mL | Freq: Once | INTRAMUSCULAR | Status: DC

## 2020-12-29 MED ORDER — ACETAMINOPHEN 10 MG/ML IV SOLN: 1000.0000 mg | Freq: Once | INTRAVENOUS | Status: DC | PRN

## 2020-12-29 MED ORDER — DEXAMETHASONE SODIUM PHOSPHATE 10 MG/ML IJ SOLN
INTRAMUSCULAR | Status: AC
  Filled 2020-12-29: qty 1

## 2020-12-29 MED ORDER — MORPHINE SULFATE (PF) 0.5 MG/ML IJ SOLN
INTRAMUSCULAR | Status: DC | PRN
  Administered 2020-12-29: .15 mg via INTRATHECAL

## 2020-12-29 MED ORDER — ONDANSETRON HCL 4 MG/2ML IJ SOLN: 4.0000 mg | Freq: Three times a day (TID) | INTRAMUSCULAR | Status: DC | PRN

## 2020-12-29 MED ORDER — METHYLERGONOVINE MALEATE 0.2 MG PO TABS: 0.2000 mg | ORAL_TABLET | ORAL | Status: DC | PRN

## 2020-12-29 MED ORDER — SIMETHICONE 80 MG PO CHEW: 80.0000 mg | CHEWABLE_TABLET | ORAL | Status: DC | PRN

## 2020-12-29 MED ORDER — ONDANSETRON HCL 4 MG/2ML IJ SOLN
INTRAMUSCULAR | Status: AC
  Filled 2020-12-29: qty 2

## 2020-12-29 SURGICAL SUPPLY — 35 items
APL SKNCLS STERI-STRIP NONHPOA (GAUZE/BANDAGES/DRESSINGS) ×1
BENZOIN TINCTURE PRP APPL 2/3 (GAUZE/BANDAGES/DRESSINGS) ×2 IMPLANT
CHLORAPREP W/TINT 26ML (MISCELLANEOUS) ×4 IMPLANT
CLAMP CORD UMBIL (MISCELLANEOUS) IMPLANT
CLOSURE STERI-STRIP 1/2X4 (GAUZE/BANDAGES/DRESSINGS) ×1
CLOTH BEACON ORANGE TIMEOUT ST (SAFETY) ×4 IMPLANT
CLSR STERI-STRIP ANTIMIC 1/2X4 (GAUZE/BANDAGES/DRESSINGS) ×1 IMPLANT
DRSG OPSITE POSTOP 4X10 (GAUZE/BANDAGES/DRESSINGS) ×4 IMPLANT
ELECT REM PT RETURN 9FT ADLT (ELECTROSURGICAL) ×3
ELECTRODE REM PT RTRN 9FT ADLT (ELECTROSURGICAL) ×2 IMPLANT
EXTRACTOR VACUUM KIWI (MISCELLANEOUS) IMPLANT
EXTRACTOR VACUUM M CUP 4 TUBE (SUCTIONS) IMPLANT
EXTRACTOR VACUUM M CUP 4' TUBE (SUCTIONS)
GLOVE BIOGEL PI IND STRL 7.0 (GLOVE) ×2 IMPLANT
GLOVE BIOGEL PI INDICATOR 7.0 (GLOVE) ×2
GLOVE ORTHO TXT STRL SZ7.5 (GLOVE) ×4 IMPLANT
GOWN STRL REUS W/TWL LRG LVL3 (GOWN DISPOSABLE) ×8 IMPLANT
KIT ABG SYR 3ML LUER SLIP (SYRINGE) IMPLANT
NDL HYPO 25X5/8 SAFETYGLIDE (NEEDLE) ×2 IMPLANT
NEEDLE HYPO 25X5/8 SAFETYGLIDE (NEEDLE) ×3 IMPLANT
NS IRRIG 1000ML POUR BTL (IV SOLUTION) ×4 IMPLANT
PACK C SECTION WH (CUSTOM PROCEDURE TRAY) ×4 IMPLANT
PAD OB MATERNITY 4.3X12.25 (PERSONAL CARE ITEMS) ×4 IMPLANT
PENCIL SMOKE EVAC W/HOLSTER (ELECTROSURGICAL) ×4 IMPLANT
RTRCTR C-SECT PINK 25CM LRG (MISCELLANEOUS) ×4 IMPLANT
SUT CHROMIC 1 CTX 36 (SUTURE) ×8 IMPLANT
SUT PLAIN 0 NONE (SUTURE) IMPLANT
SUT PLAIN 2 0 XLH (SUTURE) IMPLANT
SUT VIC AB 0 CT1 27 (SUTURE) ×6
SUT VIC AB 0 CT1 27XBRD ANBCTR (SUTURE) ×4 IMPLANT
SUT VIC AB 2-0 CT1 (SUTURE) ×4 IMPLANT
SUT VIC AB 4-0 KS 27 (SUTURE) IMPLANT
TOWEL OR 17X24 6PK STRL BLUE (TOWEL DISPOSABLE) ×4 IMPLANT
TRAY FOLEY W/BAG SLVR 14FR LF (SET/KITS/TRAYS/PACK) ×4 IMPLANT
WATER STERILE IRR 1000ML POUR (IV SOLUTION) ×4 IMPLANT

## 2020-12-29 NOTE — Anesthesia Postprocedure Evaluation (Signed)
Anesthesia Post Note  Patient: Brandy Taylor  Procedure(s) Performed: REPEAT CESAREAN SECTION     Patient location during evaluation: PACU Anesthesia Type: Spinal Level of consciousness: oriented and awake and alert Pain management: pain level controlled Vital Signs Assessment: post-procedure vital signs reviewed and stable Respiratory status: spontaneous breathing, respiratory function stable and patient connected to nasal cannula oxygen Cardiovascular status: blood pressure returned to baseline and stable Postop Assessment: no headache, no backache and no apparent nausea or vomiting Anesthetic complications: no   No notable events documented.  Last Vitals:  Vitals:   12/29/20 1301 12/29/20 1344  BP: 124/83 117/78  Pulse: 77 76  Resp: 18   Temp: (!) 36.3 C   SpO2: 98%     Last Pain:  Vitals:   12/29/20 1301  TempSrc: Axillary  PainSc: 0-No pain   Pain Goal:                   Nairi Oswald L Jalal Rauch

## 2020-12-29 NOTE — Anesthesia Preprocedure Evaluation (Signed)
Anesthesia Evaluation  °Patient identified by MRN, date of birth, ID band °Patient awake ° ° ° °Reviewed: °Allergy & Precautions, NPO status , Patient's Chart, lab work & pertinent test results ° °Airway °Mallampati: II ° °TM Distance: >3 FB °Neck ROM: Full ° ° ° Dental °no notable dental hx. ° °  °Pulmonary °neg pulmonary ROS,  °  °Pulmonary exam normal °breath sounds clear to auscultation ° ° ° ° ° ° Cardiovascular °negative cardio ROS °Normal cardiovascular exam °Rhythm:Regular Rate:Normal ° ° °  °Neuro/Psych °negative neurological ROS ° negative psych ROS  ° GI/Hepatic °negative GI ROS, Neg liver ROS,   °Endo/Other  °negative endocrine ROS ° Renal/GU °negative Renal ROS  °negative genitourinary °  °Musculoskeletal °negative musculoskeletal ROS °(+)  ° Abdominal °  °Peds ° Hematology °negative hematology ROS °(+)   °Anesthesia Other Findings °Repeat C/Sx1 ° Reproductive/Obstetrics °(+) Pregnancy ° °  ° ° ° ° ° ° ° ° ° ° ° ° ° °  °  ° ° ° ° ° ° ° °Anesthesia Physical °Anesthesia Plan ° °ASA: 2 ° °Anesthesia Plan: Spinal  ° °Post-op Pain Management:   ° °Induction:  ° °PONV Risk Score and Plan: Treatment may vary due to age or medical condition ° °Airway Management Planned: Natural Airway ° °Additional Equipment:  ° °Intra-op Plan:  ° °Post-operative Plan:  ° °Informed Consent: I have reviewed the patients History and Physical, chart, labs and discussed the procedure including the risks, benefits and alternatives for the proposed anesthesia with the patient or authorized representative who has indicated his/her understanding and acceptance.  ° ° ° °Dental advisory given ° °Plan Discussed with: CRNA ° °Anesthesia Plan Comments:   ° ° ° ° ° ° °Anesthesia Quick Evaluation ° °

## 2020-12-29 NOTE — Op Note (Signed)
Preoperative diagnosis: Intrauterine pregnancy at 39 weeks, previous c-section Postoperative diagnosis: Same Procedure: Repeat low transverse cesarean section without extensions Surgeon: Lavina Hamman M.D. Anesthesia: Spinal  Findings: Patient had normal gravid anatomy and delivered a viable female infant with Apgars of 8 and 9, 7 lbs 10 lbs Estimated blood loss: 650 cc Specimens: Placenta sent to labor and delivery Complications: None  Procedure in detail: The patient was taken to the operating room and placed in the sitting position. The anesthesiologist instilled spinal anesthesia.  She was then placed in the dorsosupine position with left tilt. Abdomen was then prepped and draped in the usual sterile fashion, and a foley catheter was inserted. The level of her anesthesia was found to be adequate. Abdomen was entered via a standard Pfannenstiel incision through her previous incision. Once the peritoneal cavity was entered the Alexis disposable self-retaining retractor was placed and good visualization was achieved. Bladder flap adhesions were released sharply.  A 4 cm transverse incision was then made in the lower uterine segment pushing the bladder inferior. Once the uterine cavity was entered the incision was extended digitally, clear amniotic fluid. The fetal vertex was grasped and delivered through the incision atraumatically. Mouth and nares were suctioned. The remainder of the infant then delivered atraumatically. Cord was doubly clamped and cut after one minute and the infant handed to the awaiting pediatric team. Cord blood was obtained. The placenta delivered spontaneously. Uterus was wiped dry with clean lap pad and all clots and debris were removed. Uterine incision was inspected and found to be free of extensions. Uterine incision was closed in 1 layer with running locking #1 Chromic, several figure 8 sutures of #1 Chromic also used for hemostasis. Tubes and ovaries were inspected and found  to be normal. Uterine incision was inspected and found to be hemostatic. Bleeding from serosal edges was controlled with electrocautery. The Alexis retractor was removed. Subfascial space was irrigated and made hemostatic with electrocautery.  Fascia was closed in running fashion starting at both ends and meeting in the middle with 0 Vicryl. Subcutaneous tissue was then irrigated and made hemostatic with electrocautery. Skin was closed with running 4-0 Vicryl subcuticular suture followed by steri-strips and a sterile dressing. Patient tolerated the procedure well and was taken to the recovery in stable condition. Counts were correct x2, she received Ancef 2 g IV at the beginning of the procedure and she had PAS hose on throughout the procedure.

## 2020-12-29 NOTE — Transfer of Care (Signed)
Immediate Anesthesia Transfer of Care Note  Patient: Brandy Taylor  Procedure(s) Performed: REPEAT CESAREAN SECTION  Patient Location: PACU  Anesthesia Type:Spinal  Level of Consciousness: awake  Airway & Oxygen Therapy: Patient Spontanous Breathing  Post-op Assessment: Report given to RN  Post vital signs: Reviewed and stable  Last Vitals:  Vitals Value Taken Time  BP 106/85 12/29/20 0840  Temp    Pulse 74 12/29/20 0842  Resp 22 12/29/20 0842  SpO2 100 % 12/29/20 0842  Vitals shown include unvalidated device data.  Last Pain:  Vitals:   12/29/20 0600  TempSrc:   PainSc: 0-No pain         Complications: No notable events documented.

## 2020-12-29 NOTE — Lactation Note (Signed)
This note was copied from a baby's chart. Lactation Consultation Note  Patient Name: Brandy Taylor [id number] Date: 12/29/2020 Reason for consult: Initial assessment;Term Age:30 hours   Initial Lactation Consult:  Mother's current feeding preference is to pump and bottle feed only.  Baby will be receiving Similac 20 until mother is able to provide adequate volume.  RN had set up the DEBP for mother; offered to review pump, however, mother feels confident in her knowledge.  Denied pain with pumping and reported the flange size is appropriate at this time.  She was able to pump a few colostrum drops.  Encouraged to finger feed any EBM she obtains to baby.  Suggested mother pump every three hours and to include breast massage and hand expression before/after feedings.  Mother did not desire hand expression review.    Mother will call for any questions/concerns.  Wash pump parts for mother and set up a wash station.  Father present.   Maternal Data Has patient been taught Hand Expression?: Yes Does the patient have breastfeeding experience prior to this delivery?: No (Pumped and bottle fed for 4 months)  Feeding Mother's Current Feeding Choice: Breast Milk and Formula  LATCH Score                    Lactation Tools Discussed/Used Tools: Pump;Flanges Flange Size: 24;27 Breast pump type: Double-Electric Breast Pump;Manual Pump Education: Setup, frequency, and cleaning (Mother stated no review necessary) Reason for Pumping: Mother desires to pump and bottle feed Pumping frequency: Every three hours  Interventions Interventions: DEBP  Discharge Pump: Manual;DEBP;Personal WIC Program: No  Consult Status Consult Status: Follow-up Date: 12/30/20 Follow-up type: In-patient    Brandy Taylor 12/29/2020, 2:18 PM

## 2020-12-29 NOTE — Interval H&P Note (Signed)
History and Physical Interval Note:  12/29/2020 7:07 AM  Brandy Taylor  has presented today for surgery, with the diagnosis of repeat c-section.  The various methods of treatment have been discussed with the patient and family. After consideration of risks, benefits and other options for treatment, the patient has consented to  Procedure(s): REPEAT CESAREAN SECTION (N/A) as a surgical intervention.  The patient's history has been reviewed, patient examined, no change in status, stable for surgery.  I have reviewed the patient's chart and labs.  Questions were answered to the patient's satisfaction.     [name] [name]

## 2020-12-29 NOTE — Anesthesia Procedure Notes (Signed)
Spinal  Patient location during procedure: OR Start time: 12/29/2020 7:30 AM End time: 12/29/2020 7:33 AM Reason for block: surgical anesthesia Staffing Performed: anesthesiologist  Anesthesiologist: Elmer Picker, MD Preanesthetic Checklist Completed: patient identified, IV checked, risks and benefits discussed, surgical consent, monitors and equipment checked, pre-op evaluation and timeout performed Spinal Block Patient position: sitting Prep: DuraPrep and site prepped and draped Patient monitoring: cardiac monitor, continuous pulse ox and blood pressure Approach: midline Location: L3-4 Injection technique: single-shot Needle Needle type: Pencan  Needle gauge: 24 G Needle length: 9 cm Assessment Sensory level: T6 Events: CSF return Additional Notes Functioning IV was confirmed and monitors were applied. Sterile prep and drape, including hand hygiene and sterile gloves were used. The patient was positioned and the spine was prepped. The skin was anesthetized with lidocaine.  Free flow of clear CSF was obtained prior to injecting local anesthetic into the CSF.  The spinal needle aspirated freely following injection.  The needle was carefully withdrawn.  The patient tolerated the procedure well.

## 2020-12-30 LAB — CBC
HCT: 22.5 % — ABNORMAL LOW (ref 36.0–46.0)
Hemoglobin: 7.7 g/dL — ABNORMAL LOW (ref 12.0–15.0)
MCH: 29.8 pg (ref 26.0–34.0)
MCHC: 34.2 g/dL (ref 30.0–36.0)
MCV: 87.2 fL (ref 80.0–100.0)
Platelets: 123 10*3/uL — ABNORMAL LOW (ref 150–400)
RBC: 2.58 MIL/uL — ABNORMAL LOW (ref 3.87–5.11)
RDW: 13.2 % (ref 11.5–15.5)
WBC: 12.7 10*3/uL — ABNORMAL HIGH (ref 4.0–10.5)
nRBC: 0 % (ref 0.0–0.2)

## 2020-12-30 LAB — BIRTH TISSUE RECOVERY COLLECTION (PLACENTA DONATION)

## 2020-12-30 MED ORDER — POLYSACCHARIDE IRON COMPLEX 150 MG PO CAPS
150.0000 mg | ORAL_CAPSULE | Freq: Every day | ORAL | Status: DC
  Administered 2020-12-30 – 2020-12-31 (×2): 150 mg via ORAL
  Filled 2020-12-30 (×2): qty 1

## 2020-12-30 NOTE — Lactation Note (Signed)
This note was copied from a baby's chart. Lactation Consultation Note  Patient Name: Brandy Taylor VAPOL'I Date: 12/30/2020 Reason for consult: Follow-up assessment Age:30 hours, term female infant with -3% weight loss. Mom's feeding choice is " pumping only" and formula feeding. Per mom, she has been using the DEBP every 3 hours for 15 minutes on initial setting and recently started pumping 30 mls per pumping session. Mom is offering her pumped EBM first before supplementing infant with formula.  Mom doesn't have any questions or concerns for LC at this time. Maternal Data    Feeding Mother's Current Feeding Choice: Breast Milk and Formula  LATCH Score                    Lactation Tools Discussed/Used    Interventions Interventions: DEBP  Discharge    Consult Status Consult Status: Follow-up Date: 12/31/20 Follow-up type: In-patient    Danelle Earthly 12/30/2020, 5:39 PM

## 2020-12-30 NOTE — Progress Notes (Signed)
Subjective: Postpartum Day #1: Cesarean Delivery Patient reports incisional pain, tolerating PO, and no problems voiding.    Objective: Vital signs in last 24 hours: Temp:  [97.4 F (36.3 C)-98.9 F (37.2 C)] 98.3 F (36.8 C) (12/23 0500) Pulse Rate:  [66-88] 82 (12/23 0500) Resp:  [13-19] 17 (12/23 0500) BP: (106-124)/(57-85) 120/77 (12/23 0500) SpO2:  [98 %-100 %] 100 % (12/23 0500)  Physical Exam:  General: alert Lochia: appropriate Uterine Fundus: firm Incision: dressing C/D/I  Recent Labs    12/27/20 0908 12/30/20 0448  HGB 10.9* 7.7*  HCT 32.4* 22.5*    Assessment/Plan: Status post Cesarean section. Doing well postoperatively. Has acute blood loss anemia, asymptomatic so far Continue current care, ambulate.  Brandy Taylor 12/30/2020, 7:59 AM

## 2020-12-31 MED ORDER — OXYCODONE HCL 5 MG PO TABS: 5.0000 mg | ORAL_TABLET | ORAL | 0 refills | Status: AC | PRN

## 2020-12-31 MED ORDER — IBUPROFEN 600 MG PO TABS: 600.0000 mg | ORAL_TABLET | Freq: Four times a day (QID) | ORAL | 0 refills | Status: AC | PRN

## 2020-12-31 NOTE — Lactation Note (Signed)
This note was copied from a baby's chart. Lactation Consultation Note  Patient Name: Brandy Taylor [id number] Date: 12/31/2020 Reason for consult: Follow-up assessment;Term Age:30 hours  Visited with mom of 15 hours old FT female, she's a P2 and experienced pumping and bottle feeding with her first baby, which was her feeding choice on admission as well with this current baby. Mom and baby are going home today. Mom reports that pumping is going well and she can see her milk coming into volume, praised her for her efforts.  Reviewed discharge instructions, engorgement prevention/treatment, sore nipples and infant feeding. Encouraged mom to continue pumping every 3 hours and to feed baby on feeding cues. FOB present and supportive. All questions and concerns answered, parents to contact LC PRN.  Maternal Data  Mom's supply is WNL  Feeding Mother's Current Feeding Choice: Breast Milk and Formula  Lactation Tools Discussed/Used Tools: Pump;Flanges Flange Size: 24;27 Breast pump type: Double-Electric Breast Pump Pump Education: Setup, frequency, and cleaning;Milk Storage Reason for Pumping: mother's choice to pump and bottle feed only Pumping frequency: 6-7 times/24 hours Pumped volume: 40 mL  Interventions Interventions: Breast feeding basics reviewed;DEBP;Education  Discharge Discharge Education: Engorgement and breast care;Warning signs for feeding baby Pump: DEBP;Manual;Personal (Aauto DEBP wearable pump)  Consult Status Consult Status: Complete Date: 12/31/20 Follow-up type: Call as needed   Rockie Schnoor Venetia Constable 12/31/2020, 8:37 AM

## 2020-12-31 NOTE — Discharge Summary (Signed)
Postpartum Discharge Summary  Date of Service updated 12/31/2020     Patient Name: Brandy Taylor DOB: Jun 01, 1990 MRN: [id number]  Date of admission: 12/29/2020 Delivery date:12/29/2020  Delivering provider: Willis Modena, TODD  Date of discharge: 12/31/2020  Admitting diagnosis: Maternal care due to low transverse uterine scar from previous cesarean delivery [O34.211] S/P cesarean section [Z98.891] Intrauterine pregnancy: [redacted]w[redacted]d     Secondary diagnosis:  Principal Problem:   Maternal care due to low transverse uterine scar from previous cesarean delivery Active Problems:   S/P cesarean section     Discharge diagnosis: Term Pregnancy Delivered                                              Post partum procedures: NA Augmentation: N/A Complications: None  Hospital course: Sceduled C/S   30 y.o. yo G2P1001 at [redacted]w[redacted]d was admitted to the hospital 12/29/2020 for scheduled cesarean section with the following indication:Elective Repeat.Delivery details are as follows:  Membrane Rupture Time/Date: 7:55 AM ,12/29/2020   Delivery Method:C-Section, Low Transverse  Details of operation can be found in separate operative note.  Patient had an uncomplicated postpartum course.  She is ambulating, tolerating a regular diet, passing flatus, and urinating well. Patient is discharged home in stable condition on  12/31/20        Newborn Data: Birth date:12/29/2020  Birth time:7:55 AM  Gender:Female  Living status:Living  Apgars:8 ,9  Weight:3460 g     Magnesium Sulfate received: No BMZ received: No Rhophylac:N/A MMR:N/A   Physical exam  Vitals:   12/30/20 0500 12/30/20 1459 12/30/20 1930 12/31/20 0505  BP: 120/77 109/70 120/74 111/79  Pulse: 82 77 85 80  Resp: $Remo'17  16 16  'KeHZR$ Temp: 98.3 F (36.8 C) 98.9 F (37.2 C) 98.6 F (37 C) 98.4 F (36.9 C)  TempSrc: Oral Oral Oral Oral  SpO2: 100%     Weight:      Height:       General: alert, cooperative, and no distress Lochia:  appropriate Uterine Fundus: firm Incision: Healing well with no significant drainage DVT Evaluation: No evidence of DVT seen on physical exam. Labs: Lab Results  Component Value Date   WBC 12.7 (H) 12/30/2020   HGB 7.7 (L) 12/30/2020   HCT 22.5 (L) 12/30/2020   MCV 87.2 12/30/2020   PLT 123 (L) 12/30/2020   CMP Latest Ref Rng & Units 06/19/2015  Glucose 65 - 99 mg/dL 79  BUN 6 - 20 mg/dL 14  Creatinine 0.44 - 1.00 mg/dL 0.55  Sodium 135 - 145 mmol/L 133(L)  Potassium 3.5 - 5.1 mmol/L 4.6  Chloride 101 - 111 mmol/L 104  CO2 22 - 32 mmol/L 21(L)  Calcium 8.9 - 10.3 mg/dL 8.6(L)  Total Protein 6.5 - 8.1 g/dL 6.8  Total Bilirubin 0.3 - 1.2 mg/dL 0.8  Alkaline Phos 38 - 126 U/L 84  AST 15 - 41 U/L 31  ALT 14 - 54 U/L 30   Edinburgh Score: Edinburgh Postnatal Depression Scale Screening Tool 12/30/2020  I have been able to laugh and see the funny side of things. 0  I have looked forward with enjoyment to things. 0  I have blamed myself unnecessarily when things went wrong. 1  I have been anxious or worried for no good reason. 1  I have felt scared or panicky for no good reason. 0  Things have been getting on top of me. 1  I have been so unhappy that I have had difficulty sleeping. 0  I have felt sad or miserable. 1  I have been so unhappy that I have been crying. 0  The thought of harming myself has occurred to me. 0  Edinburgh Postnatal Depression Scale Total 4      After visit meds:  Allergies as of 12/31/2020   No Known Allergies      Medication List     STOP taking these medications    aspirin EC 81 MG tablet   Prenatal Vitamin 27-0.8 MG Tabs       TAKE these medications    ibuprofen 600 MG tablet Commonly known as: ADVIL Take 1 tablet (600 mg total) by mouth every 6 (six) hours as needed.   oxyCODONE 5 MG immediate release tablet Commonly known as: Roxicodone Take 1 tablet (5 mg total) by mouth every 4 (four) hours as needed for severe pain.                Discharge Care Instructions  (From admission, onward)           Start     Ordered   12/31/20 0000  Discharge wound care:       Comments: For a cesarean delivery: You may wash incision with soap and water.  Do not soak or submerge the incision for 2 weeks. Keep incision dry. You may need to keep a sanitary pad or panty liner between the incision and your clothing for comfort and to keep the incision dry. If you note drainage, increased pain, or increased redness of the incision, then please notify your physician.   12/31/20 1138   12/31/20 0000  If the dressing is still on your incision site when you go home, remove it on the third day after your surgery date. Remove dressing if it begins to fall off, or if it is dirty or damaged before the third day.       Comments: For a cesarean delivery   12/31/20 1138             Discharge home in stable condition Infant Feeding: Breast Infant Disposition:home with mother Discharge instruction: per After Visit Summary and Postpartum booklet. Activity: Advance as tolerated. Pelvic rest for 6 weeks.  Diet: routine diet Anticipated Birth Control: Unsure Postpartum Appointment:4 weeks Future Appointments:No future appointments. Follow up Visit:      12/31/2020 Vanessa Kick, MD

## 2021-01-10 ENCOUNTER — Telehealth (HOSPITAL_COMMUNITY): Payer: Self-pay | Admitting: *Deleted

## 2021-01-10 NOTE — Telephone Encounter (Signed)
Patient voiced no questions or concerns at this time. EPDS=2. Patient voiced no questions or concerns regarding infant at this time. Patient reports infant sleeps in a bassinet on his back. RN reviewed ABCs of safe sleep. Patient verbalized understanding. Patient informed about hospital's virtual postpartum classes and support groups - declined email information. Deforest Hoyles, RN,  01/10/21, [phone]

## 2023-01-29 ENCOUNTER — Ambulatory Visit
Admission: EM | Admit: 2023-01-29 | Discharge: 2023-01-29 | Disposition: A | Discharge status: Home / Self Care | Payer: 59 | Attending: Emergency Medicine | Admitting: Emergency Medicine

## 2023-01-29 DIAGNOSIS — J069 Acute upper respiratory infection, unspecified: Secondary | ICD-10-CM | POA: Diagnosis not present

## 2023-01-29 MED ORDER — BENZONATATE 100 MG PO CAPS: 100.0000 mg | ORAL_CAPSULE | Freq: Three times a day (TID) | ORAL | 0 refills | Status: AC | PRN

## 2023-01-29 NOTE — Discharge Instructions (Addendum)
Mucinex DM (I recommend Equate [employer] brand) 2 tablets (1200 mg total) twice daily Take with LOTS of fluids!  The tessalon cough pills can be taken 3x daily. If this medication makes you drowsy, take only one pill before bed.  Please monitor symptoms, if worsening over the weekend please return for re-evaluation

## 2023-01-29 NOTE — ED Triage Notes (Signed)
Pt presents with c/o cough and chills x 4 days and fever that started Saturday. Has been feeling tired and c/o body aches  Home interventions: tylenol and dayquil.

## 2023-01-29 NOTE — ED Provider Notes (Signed)
[facility]    CSN: [id number] Arrival date & time: 01/29/23  1603      History   Chief Complaint Chief Complaint  Patient presents with   Fever   Cough    HPI Brandy Taylor is a 33 y.o. female.  4-day history of fever, chills, body aches, cough She had 1 day fever free until it returned today.  Tmax 101 The body aches are gone but cough persists.  Mix of dry and productive. Not having wheezing or shortness of breath She has used Tylenol and DayQuil  Past Medical History:  Diagnosis Date   Medical history non-contributory    Short cervix with cervical cerclage in third trimester, antepartum 06/08/2015   Threatened preterm labor 06/16/2015    Patient Active Problem List   Diagnosis Date Noted   Maternal care due to low transverse uterine scar from previous cesarean delivery 12/29/2020   S/P cesarean section 12/29/2020   Oligo-ovulation 07/10/2018   Oligomenorrhea 12/04/2016   Postpartum state 06/24/2015   Threatened preterm labor 06/16/2015   Short cervix with cervical cerclage in third trimester, antepartum 06/08/2015    Past Surgical History:  Procedure Laterality Date   ANTERIOR CRUCIATE LIGAMENT REPAIR     CESAREAN SECTION     CESAREAN SECTION N/A 12/29/2020   Procedure: REPEAT CESAREAN SECTION;  Surgeon: Lavina Hamman, MD;  Location: [facility];  Service: Obstetrics;  Laterality: N/A;   EYE SURGERY      OB History     Gravida  2   Para  1   Term  1   Preterm      AB      Living  1      SAB      IAB      Ectopic      Multiple  0   Live Births  1            Home Medications    Prior to Admission medications   Medication Sig Start Date End Date Taking? Authorizing Provider  benzonatate (TESSALON) 100 MG capsule Take 1 capsule (100 mg total) by mouth 3 (three) times daily as needed for cough. 01/29/23  Yes Raygen Linquist, Lurena Joiner, PA-C  ibuprofen (ADVIL) 600 MG tablet Take 1 tablet (600 mg total) by mouth every 6 (six) hours  as needed. 12/31/20   Waynard Reeds, MD  oxyCODONE (ROXICODONE) 5 MG immediate release tablet Take 1 tablet (5 mg total) by mouth every 4 (four) hours as needed for severe pain. 12/31/20   Waynard Reeds, MD    Family History Family History  Problem Relation Age of Onset   Hypertension Mother    Hypertension Maternal Grandmother     Social History Social History   Tobacco Use   Smoking status: Never   Smokeless tobacco: Never  Vaping Use   Vaping status: Never Used  Substance Use Topics   Alcohol use: No   Drug use: No     Allergies   Patient has no known allergies.   Review of Systems Review of Systems Per HPI  Physical Exam Triage Vital Signs ED Triage Vitals  Encounter Vitals Group     BP 01/29/23 1707 119/75     Systolic BP Percentile --      Diastolic BP Percentile --      Pulse Rate 01/29/23 1707 83     Resp 01/29/23 1707 16     Temp 01/29/23 1707 100 F (37.8 C)  Temp Source 01/29/23 1707 Oral     SpO2 01/29/23 1707 (!) 87 %     Weight --      Height --      Head Circumference --      Peak Flow --      Pain Score 01/29/23 1706 5     Pain Loc --      Pain Education --      Exclude from Growth Chart --    No data found.  Updated Vital Signs BP 119/75 (BP Location: Left Arm)   Pulse 83   Temp 100 F (37.8 C) (Oral)   Resp 16   LMP 01/25/2023 (Exact Date)   SpO2 97%   Breastfeeding No    Physical Exam Vitals and nursing note reviewed.  Constitutional:      General: She is not in acute distress.    Appearance: Normal appearance.  HENT:     Right Ear: Tympanic membrane and ear canal normal.     Left Ear: Tympanic membrane and ear canal normal.     Nose: No congestion.     Mouth/Throat:     Mouth: Mucous membranes are moist.     Pharynx: Oropharynx is clear.  Eyes:     Conjunctiva/sclera: Conjunctivae normal.  Cardiovascular:     Rate and Rhythm: Normal rate and regular rhythm.     Pulses: Normal pulses.     Heart sounds: Normal  heart sounds.  Pulmonary:     Effort: Pulmonary effort is normal. No respiratory distress.     Breath sounds: Normal breath sounds. No wheezing or rales.  Abdominal:     Palpations: Abdomen is soft.  Musculoskeletal:     Cervical back: Normal range of motion. No rigidity.  Lymphadenopathy:     Cervical: No cervical adenopathy.  Skin:    General: Skin is warm and dry.  Neurological:     Mental Status: She is alert and oriented to person, place, and time.     UC Treatments / Results  Labs (all labs ordered are listed, but only abnormal results are displayed) Labs Reviewed - No data to display  EKG  Radiology No results found.  Procedures Procedures  Medications Ordered in UC Medications - No data to display  Initial Impression / Assessment and Plan / UC Course  I have reviewed the triage vital signs and the nursing notes.  Pertinent labs & imaging results that were available during my care of the patient were reviewed by me and considered in my medical decision making (see chart for details).  Temp is 100 in clinic.  Lungs are clear, satting 97% on room air.  Discussed at this time likely viral etiology.  I recommend continuing with symptomatic care and monitoring over the next 4 to 5 days.  Advised Mucinex DM and Tessalon.  Can return if feels symptoms are progressing or fever persists.  A work note is provided.  Patient is agreeable to plan, no questions at this time  Final Clinical Impressions(s) / UC Diagnoses   Final diagnoses:  Viral URI with cough     Discharge Instructions      Mucinex DM (I recommend Equate [employer] brand) 2 tablets (1200 mg total) twice daily Take with LOTS of fluids!  The tessalon cough pills can be taken 3x daily. If this medication makes you drowsy, take only one pill before bed.  Please monitor symptoms, if worsening over the weekend please return for re-evaluation  ED Prescriptions     Medication Sig Dispense Auth.  Provider   benzonatate (TESSALON) 100 MG capsule Take 1 capsule (100 mg total) by mouth 3 (three) times daily as needed for cough. 30 capsule Kritika Stukes, Lurena Joiner, PA-C      PDMP not reviewed this encounter.   Marlow Baars, [place] 01/29/23 1610
